# Patient Record
Sex: Male | Born: 1990 | Race: Black or African American | Hispanic: No | Marital: Single | Smoking: Never smoker
Health system: Southern US, Community
[De-identification: ages and names within clinical notes are randomized; demographics above are authoritative.]

## PROBLEM LIST (undated history)

## (undated) HISTORY — PX: OTHER SURGICAL HISTORY: SHX169

---
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
## 2011-03-23 LAB — RAPID STREP SCREEN (MED CTR MEBANE ONLY): Streptococcus, Group A Screen (Direct): POSITIVE — AB

## 2011-09-18 LAB — GC/CHLAMYDIA PROBE AMP, GENITAL
Chlamydia, DNA Probe: NEGATIVE
GC Probe Amp, Genital: NEGATIVE

## 2015-07-18 ENCOUNTER — Emergency Department (HOSPITAL_COMMUNITY)
Admission: EM | Admit: 2015-07-18 | Discharge: 2015-07-18 | Disposition: A | Discharge status: Home / Self Care | Payer: BLUE CROSS/BLUE SHIELD | Attending: Emergency Medicine | Admitting: Emergency Medicine

## 2015-07-18 ENCOUNTER — Emergency Department (HOSPITAL_COMMUNITY): Payer: BLUE CROSS/BLUE SHIELD

## 2015-07-18 ENCOUNTER — Encounter (HOSPITAL_COMMUNITY): Payer: Self-pay | Admitting: *Deleted

## 2015-07-18 DIAGNOSIS — B2799 Infectious mononucleosis, unspecified with other complication: Secondary | ICD-10-CM

## 2015-07-18 DIAGNOSIS — R197 Diarrhea, unspecified: Secondary | ICD-10-CM | POA: Diagnosis not present

## 2015-07-18 DIAGNOSIS — K759 Inflammatory liver disease, unspecified: Secondary | ICD-10-CM | POA: Diagnosis not present

## 2015-07-18 DIAGNOSIS — B2790 Infectious mononucleosis, unspecified without complication: Secondary | ICD-10-CM | POA: Diagnosis not present

## 2015-07-18 DIAGNOSIS — R112 Nausea with vomiting, unspecified: Secondary | ICD-10-CM | POA: Diagnosis present

## 2015-07-18 DIAGNOSIS — B178 Other specified acute viral hepatitis: Secondary | ICD-10-CM

## 2015-07-18 LAB — COMPREHENSIVE METABOLIC PANEL
ALBUMIN: 3.9 g/dL (ref 3.5–5.0)
ALK PHOS: 175 U/L — AB (ref 38–126)
ALT: 660 U/L — ABNORMAL HIGH (ref 17–63)
ANION GAP: 5 (ref 5–15)
AST: 326 U/L — ABNORMAL HIGH (ref 15–41)
BILIRUBIN TOTAL: 0.5 mg/dL (ref 0.3–1.2)
BUN: 12 mg/dL (ref 6–20)
CALCIUM: 8.6 mg/dL — AB (ref 8.9–10.3)
CHLORIDE: 107 mmol/L (ref 101–111)
CO2: 26 mmol/L (ref 22–32)
Creatinine, Ser: 1.2 mg/dL (ref 0.61–1.24)
GFR calc Af Amer: >60 mL/min (ref >60)
GFR calc non Af Amer: >60 mL/min (ref >60)
Glucose, Bld: 96 mg/dL (ref 65–99)
POTASSIUM: 4 mmol/L (ref 3.5–5.1)
SODIUM: 138 mmol/L (ref 135–145)
Total Protein: 7.8 g/dL (ref 6.5–8.1)

## 2015-07-18 LAB — CBC WITH DIFFERENTIAL/PLATELET
BASOS ABS: 0.3 10*3/uL — AB (ref 0.0–0.1)
BASOS PCT: 2 % — AB (ref 0–1)
Eosinophils Absolute: 0.1 10*3/uL (ref 0.0–0.7)
Eosinophils Relative: 0 % (ref 0–5)
HCT: 41.6 % (ref 39.0–52.0)
HEMOGLOBIN: 13.8 g/dL (ref 13.0–17.0)
Lymphocytes Relative: 61 % — ABNORMAL HIGH (ref 12–46)
Lymphs Abs: 10.9 10*3/uL — ABNORMAL HIGH (ref 0.7–4.0)
MCH: 29.2 pg (ref 26.0–34.0)
MCHC: 33.2 g/dL (ref 30.0–36.0)
MCV: 88.1 fL (ref 78.0–100.0)
Monocytes Absolute: 2.1 10*3/uL — ABNORMAL HIGH (ref 0.1–1.0)
Monocytes Relative: 12 % (ref 3–12)
NEUTROS ABS: 4.4 10*3/uL (ref 1.7–7.7)
NEUTROS PCT: 25 % — AB (ref 43–77)
Platelets: 193 10*3/uL (ref 150–400)
RBC: 4.72 MIL/uL (ref 4.22–5.81)
RDW: 13.9 % (ref 11.5–15.5)
WBC: 17.8 10*3/uL — ABNORMAL HIGH (ref 4.0–10.5)

## 2015-07-18 LAB — URINALYSIS, ROUTINE W REFLEX MICROSCOPIC
BILIRUBIN URINE: NEGATIVE
Glucose, UA: NEGATIVE mg/dL
Hgb urine dipstick: NEGATIVE
Ketones, ur: NEGATIVE mg/dL
LEUKOCYTES UA: NEGATIVE
NITRITE: NEGATIVE
Protein, ur: NEGATIVE mg/dL
UROBILINOGEN UA: 0.2 mg/dL (ref 0.0–1.0)
pH: 6 (ref 5.0–8.0)

## 2015-07-18 LAB — LIPASE, BLOOD: Lipase: 18 U/L — ABNORMAL LOW (ref 22–51)

## 2015-07-18 LAB — PROTIME-INR
INR: 0.99 (ref 0.00–1.49)
Prothrombin Time: 13.3 seconds (ref 11.6–15.2)

## 2015-07-18 LAB — ACETAMINOPHEN LEVEL

## 2015-07-18 LAB — MONONUCLEOSIS SCREEN: MONO SCREEN: POSITIVE — AB

## 2015-07-18 LAB — ETHANOL: Alcohol, Ethyl (B): <5 mg/dL (ref <5)

## 2015-07-18 MED ORDER — PROMETHAZINE HCL 25 MG PO TABS: 25.0000 mg | ORAL_TABLET | Freq: Four times a day (QID) | ORAL | Status: DC | PRN

## 2015-07-18 NOTE — ED Notes (Signed)
Pt reports vomiting off and on in the morning x 2 weeks. Denies pain, heartburn, trouble with urination. Reports diarrhea (2 times in the last 24 hours). Diarrhea and vomiting do not seem to correspond together per pt.

## 2015-07-18 NOTE — Discharge Instructions (Signed)
Infectious Mononucleosis Infectious mononucleosis (mono) is a common germ (viral) infection in children, teenagers, and young adults.  CAUSES  Mono is an infection caused by the Malachi Carl virus. The virus is spread by close personal contact with someone who has the infection. It can be passed by contact with your saliva through things such as kissing or sharing drinking glasses. Sometimes, the infection can be spread from someone who does not appear sick but still spreads the virus (asymptomatic carrier state).  SYMPTOMS  The most common symptoms of Mono are:  Sore throat.  Headache.  Fatigue.  Muscle aches.  Swollen glands.  Fever.  Poor appetite.  Enlarged liver or spleen. The less common symptoms can include:  Rash.  Feeling sick to your stomach (nauseous).  Abdominal pain. DIAGNOSIS  Mono is diagnosed by a blood test.  TREATMENT  Treatment of mono is usually at home. There is no medicine that cures this virus. Sometimes hospital treatment is needed in severe cases. Steroid medicine sometimes is needed if the swelling in the throat causes breathing or swallowing problems.  HOME CARE INSTRUCTIONS   Drink enough fluids to keep your urine clear or pale yellow.  Eat soft foods. Cool foods like popsicles or ice cream can soothe a sore throat.  Only take over-the-counter or prescription medicines for pain, discomfort, or fever as directed by your caregiver. Children under 35 years of age should not take aspirin.  Gargle salt water. This may help relieve your sore throat. Put 1 teaspoon (tsp) of salt in 1 cup of warm water. Sucking on hard candy may also help.  Rest as needed.  Start regular activities gradually after the fever is gone. Be sure to rest when tired.  Avoid strenuous exercise or contact sports until your caregiver says it is okay. The liver and spleen could be seriously injured.  Avoid sharing drinking glasses or kissing until your caregiver tells you  that you are no longer contagious. SEEK MEDICAL CARE IF:   Your fever is not gone after 7 days.  Your activity level is not back to normal after 2 weeks.  You have yellow coloring to eyes and skin (jaundice). SEEK IMMEDIATE MEDICAL CARE IF:   You have severe pain in the abdomen or shoulder.  You have trouble swallowing or drooling.  You have trouble breathing.  You develop a stiff neck.  You develop a severe headache.  You cannot stop throwing up (vomiting).  You have convulsions.  You are confused.  You have trouble with balance.  You develop signs of body fluid loss (dehydration):  Weakness.  Sunken eyes.  Pale skin.  Dry mouth.  Rapid breathing or pulse. MAKE SURE YOU:   Understand these instructions.  Will watch your condition.  Will get help right away if you are not doing well or get worse. Document Released: 11/20/2000 Document Revised: 02/15/2012 Document Reviewed: 09/18/2008 [facility] Patient Information 2015 [place], [place]. This information is not intended to replace advice given to you by your health care provider. Make sure you discuss any questions you have with your health care provider.   Call Dr. Benard Rink as discussed for an appointment on Monday or Tuesday for recheck of your symptoms and your lab tests including your liver functions.  It is important that she rest and make sure you drinking plenty of fluids.  You may use Phenergan if needed for nausea or vomiting.  It is important that you avoid any activity such as contact sports on tell your infection  is completely resolved.

## 2015-07-18 NOTE — ED Provider Notes (Signed)
CSN: [id number]     Arrival date & time 07/18/15  4098 History   First MD Initiated Contact with Patient 07/18/15 1001     Chief Complaint  Patient presents with  . Vomiting     (Consider location/radiation/quality/duration/timing/severity/associated sxs/prior Treatment) The history is provided by the patient and a parent.   Gary Humphrey is a 24 y.o. male  Gary Humphrey is a 24 y.o. male presenting with a 2 week history of nausea and emesis occuring in the morning only.  He describes waking feeling nauseated and usually by the time he is brushing his teeth, he is triggered to vomit x 1, then his symptoms are improved.  He denies fevers, chills, abdominal pain, dysuria.  He does endorse having brown watery stools, 2-3 times daily (normal pattern of stooling, but watery stool is new).  He denies bloody stools and has had no recent antibiotic use.  He denies heartburn except when he eats [place] food.  He took pepto bismol for the first time this am which improved his symptoms.  He reports being afraid to eat in case it makes his sx worse but denies eating has actually worsened sx.  He states he woke with a mild sore throat which started today.     History reviewed. No pertinent past medical history. History reviewed. No pertinent past surgical history. No family history on file. Social History  Substance Use Topics  . Smoking status: Never Smoker   . Smokeless tobacco: None  . Alcohol Use: No    Review of Systems  Constitutional: Negative for fever.  HENT: Negative for congestion and sore throat.   Eyes: Negative.   Respiratory: Negative for chest tightness and shortness of breath.   Cardiovascular: Negative for chest pain.  Gastrointestinal: Positive for nausea and vomiting. Negative for abdominal pain and abdominal distention.  Genitourinary: Negative.   Musculoskeletal: Negative for joint swelling, arthralgias and neck pain.  Skin: Negative.  Negative for  rash and wound.  Neurological: Negative for dizziness, weakness, light-headedness, numbness and headaches.  Psychiatric/Behavioral: Negative.       Allergies  Review of patient's allergies indicates no known allergies.  Home Medications   Prior to Admission medications   Medication Sig Start Date End Date Taking? Authorizing Provider  promethazine (PHENERGAN) 25 MG tablet Take 1 tablet (25 mg total) by mouth every 6 (six) hours as needed for nausea or vomiting. 07/18/15   Burgess Amor, PA-C   BP 134/62 mmHg  Pulse 67  Temp(Src) 98 F (36.7 C) (Oral)  Resp 18  Ht 6' (1.829 m)  Wt 214 lb (97.07 kg)  BMI 29.02 kg/m2  SpO2 100% Physical Exam  Constitutional: He appears well-developed and well-nourished.  HENT:  Head: Normocephalic and atraumatic.  Mouth/Throat: Oropharynx is clear and moist. No oropharyngeal exudate.  Eyes: Conjunctivae are normal.  Neck: Normal range of motion.  Cardiovascular: Normal rate, regular rhythm, normal heart sounds and intact distal pulses.   Pulmonary/Chest: Effort normal and breath sounds normal. He has no wheezes.  Abdominal: Soft. Bowel sounds are normal. He exhibits no distension. There is no tenderness. There is no rebound, no guarding, no tenderness at McBurney's point and negative Murphy's sign.  Musculoskeletal: Normal range of motion.  Neurological: He is alert.  Skin: Skin is warm and dry.  Psychiatric: He has a normal mood and affect.  Nursing note and vitals reviewed.   ED Course  Procedures (including critical care time) Labs Review Labs Reviewed  COMPREHENSIVE METABOLIC PANEL -  Abnormal; Notable for the following:    Calcium 8.6 (*)    AST 326 (*)    ALT 660 (*)    Alkaline Phosphatase 175 (*)    All other components within normal limits  LIPASE, BLOOD - Abnormal; Notable for the following:    Lipase 18 (*)    All other components within normal limits  CBC WITH DIFFERENTIAL/PLATELET - Abnormal; Notable for the following:     WBC 17.8 (*)    Neutrophils Relative % 25 (*)    Lymphocytes Relative 61 (*)    Lymphs Abs 10.9 (*)    Monocytes Absolute 2.1 (*)    Basophils Relative 2 (*)    Basophils Absolute 0.3 (*)    All other components within normal limits  URINALYSIS, ROUTINE W REFLEX MICROSCOPIC (NOT AT [facility]) - Abnormal; Notable for the following:    Specific Gravity, Urine >1.030 (*)    All other components within normal limits  ACETAMINOPHEN LEVEL - Abnormal; Notable for the following:    Acetaminophen (Tylenol), Serum <10 (*)    All other components within normal limits  MONONUCLEOSIS SCREEN - Abnormal; Notable for the following:    Mono Screen POSITIVE (*)    All other components within normal limits  ETHANOL  PROTIME-INR  HEPATITIS PANEL, ACUTE    Imaging Review US Abdomen Limited  07/18/2015   CLINICAL DATA:  Nausea, vomiting, and right upper quadrant pain for 2 weeks. Elevated liver function tests.  EXAM: US ABDOMEN LIMITED - RIGHT UPPER QUADRANT  COMPARISON:  None.  FINDINGS: Gallbladder:  No gallstones or wall thickening visualized. No sonographic Murphy sign noted.  Common bile duct:  Diameter: 4 mm  Liver:  No focal lesion identified. Within normal limits in parenchymal echogenicity.  IMPRESSION: Unremarkable right upper quadrant ultrasound.   Electronically Signed   By: Sebastian Ache   On: 07/18/2015 12:57     EKG Interpretation None      MDM   Final diagnoses:  Mononucleosis, infectious, with hepatitis    Patients labs and/or radiological studies were reviewed and considered during the medical decision making and disposition process.   Imaging was reviewed, interpreted and I agree with radiologists reading.  Results were also discussed with patient. Pt's labs discussed with Dr. Jena Gauss, added INR, mono.  Pt to f/u with Dr. Jena Gauss in 5 days for repeat labs, recheck of sx.  He was prescribed phenergan, advised rest, increased fluid intake. Discussed avoiding contact sports, risks for  falls/injury. Pt understands plan.  Mother at bedside agrees and understands plan as well.    Burgess Amor, PA-C 07/18/15 1531  Glynn Octave, MD 07/18/15 [phone]

## 2015-07-19 LAB — HEPATITIS PANEL, ACUTE
HEP B C IGM: NEGATIVE
HEP B S AG: NEGATIVE
Hep A IgM: NEGATIVE

## 2015-07-23 ENCOUNTER — Encounter: Payer: Self-pay | Admitting: Nurse Practitioner

## 2015-07-23 ENCOUNTER — Ambulatory Visit (INDEPENDENT_AMBULATORY_CARE_PROVIDER_SITE_OTHER): Payer: Self-pay | Admitting: Nurse Practitioner

## 2015-07-23 VITALS — BP 139/80 | HR 60 | Temp 97.0°F | Ht 72.0 in | Wt 217.4 lb

## 2015-07-23 DIAGNOSIS — B2790 Infectious mononucleosis, unspecified without complication: Secondary | ICD-10-CM

## 2015-07-23 DIAGNOSIS — R748 Abnormal levels of other serum enzymes: Secondary | ICD-10-CM

## 2015-07-23 LAB — HEPATIC FUNCTION PANEL
ALT: 196 U/L — AB (ref 9–46)
AST: 47 U/L — ABNORMAL HIGH (ref 10–40)
Albumin: 3.8 g/dL (ref 3.6–5.1)
Alkaline Phosphatase: 145 U/L — ABNORMAL HIGH (ref 40–115)
BILIRUBIN DIRECT: 0.1 mg/dL (ref <=0.2)
BILIRUBIN INDIRECT: 0.3 mg/dL (ref 0.2–1.2)
BILIRUBIN TOTAL: 0.4 mg/dL (ref 0.2–1.2)
Total Protein: 7.7 g/dL (ref 6.1–8.1)

## 2015-07-23 LAB — BASIC METABOLIC PANEL
BUN: 10 mg/dL (ref 7–25)
CO2: 25 mmol/L (ref 20–31)
Calcium: 9 mg/dL (ref 8.6–10.3)
Chloride: 104 mmol/L (ref 98–110)
Creat: 1.04 mg/dL (ref 0.60–1.35)
Glucose, Bld: 88 mg/dL (ref 65–99)
Potassium: 4.8 mmol/L (ref 3.5–5.3)
Sodium: 138 mmol/L (ref 135–146)

## 2015-07-23 NOTE — Progress Notes (Signed)
No pcp per patient 

## 2015-07-23 NOTE — Assessment & Plan Note (Signed)
24 year old male diagnosed with infectious mononucleosis and noted significant elevation in liver labs in the ER 5 days ago. Bilirubin was normal. He does not take any routine over-the-counter, prescription, or herbal medications. Does not drink. Does not smoke. He smokes occasional marijuana. Acute hepatitis panel negative. INR normal. Today I will recheck his labs including CBC, basic metabolic profile, hepatic function panel, and we'll add autoimmune labs for completeness. We'll have him return in 4 weeks for follow-up.

## 2015-07-23 NOTE — Progress Notes (Signed)
Primary Care Physician:  No PCP Per Patient Primary Gastroenterologist:  Dr. Jena Gauss  Chief Complaint  Patient presents with  . Abdominal Pain    HPI:   24 year old male presents on referral from emergency department to follow-up on abnormal labs. Patient was in the emergency room 5 days ago complaining of 2 weeks of nausea and vomiting as well as 2-3 loose stools a day, which is new. His bedside exam was essentially normal. His lab results revealed leukocytosis WBC count 17.8, transaminitis with AST/ALT of 326/660, alkaline phosphatase of 175. His mononucleosis screen was positive, INR was normal at 0.99, and acute hepatitis panel including hep C, hep B, and hep a all negative. Ethanol and acetaminophen levels will also normal, lipase normal. Right upper quadrant ultrasound was essentially normal revealing normal common bile duct diameter, no gallstones, liver within normal limits. He was recommended for 5 day follow-up in our office.  Today he states his symptoms have improved. Is keeping food down well, nausea has essentially resolved, and energy level has improved. Denies fever/chills (which has improved), stools are improving and "trying to solidify" although still somewhat loose. Sore throat has improved. He has been getting a lot of rest, fluids, and hot tea. Denies abdominal pain. Denies yellowing of skin eyes, darkened urine, confusion, mental status changes. Denies dyspnea, dizziness, lightheadedness, syncope, near syncope. Denies any other upper or lower GI symptoms.  Note: denies any past medical history to date. Occupation: Copy.   No past medical history on file.  Past Surgical History  Procedure Laterality Date  . None to date      07/23/15    Current Outpatient Prescriptions  Medication Sig Dispense Refill  . promethazine (PHENERGAN) 25 MG tablet Take 1 tablet (25 mg total) by mouth every 6 (six) hours as needed for nausea or vomiting. (Patient  not taking: Reported on 07/23/2015) 30 tablet 0   No current facility-administered medications for this visit.    Allergies as of 07/23/2015  . (No Known Allergies)    Family History  Problem Relation Age of Onset  . Colon cancer Neg Hx   . Breast cancer Maternal Aunt   . Liver disease Neg Hx     Social History   Social History  . Marital Status: Single    Spouse Name: N/A  . Number of Children: N/A  . Years of Education: N/A   Occupational History  . Not on file.   Social History Main Topics  . Smoking status: Never Smoker   . Smokeless tobacco: Not on file     Comment: smokes pot  . Alcohol Use: No  . Drug Use: Yes    Special: Marijuana     Comment: smokes marijuana twice daily   . Sexual Activity: Not on file   Other Topics Concern  . Not on file   Social History Narrative    Review of Systems: General: Negative for anorexia, weight loss, fever, chills. Admits residual fatigue. Eyes: Negative for vision changes.  ENT: Negative for hoarseness, difficulty swallowing. CV: Negative for chest pain, angina, palpitations, dyspnea on exertion, peripheral edema.  Respiratory: Negative for dyspnea at rest, dyspnea on exertion, cough, sputum, wheezing.  GI: See history of present illness. Derm: Negative for rash or itching. Endo: Negative for unusual weight change.  Heme: Negative for bruising or bleeding. Allergy: Negative for rash or hives.    Physical Exam: BP 139/80 mmHg  Pulse 60  Temp(Src) 97 F (36.1 C)  Ht  6' (1.829 m)  Wt 217 lb 6.4 oz (98.612 kg)  BMI 29.48 kg/m2 General:   Alert and oriented. Pleasant and cooperative. Well-nourished and well-developed.  Head:  Normocephalic and atraumatic. Eyes:  Without icterus, sclera clear and conjunctiva pink.  Ears:  Normal auditory acuity. Cardiovascular:  S1, S2 present without murmurs appreciated. Normal pulses noted. Extremities without clubbing or edema. Respiratory:  Clear to auscultation bilaterally.  No wheezes, rales, or rhonchi. No distress.  Gastrointestinal:  +BS, soft, non-tender and non-distended. No hepatomegaly noted. Possible mild/minimal splenomegaly noted just below the left costal margin but without tenderness. No guarding or rebound. No masses appreciated.  Rectal:  Deferred  Skin:  Intact without significant lesions or rashes. Neurologic:  Alert and oriented x4;  grossly normal neurologically. Psych:  Alert and cooperative. Normal mood and affect. Heme/Lymph/Immune: No excessive bruising noted.    07/23/2015 8:38 AM

## 2015-07-23 NOTE — Assessment & Plan Note (Signed)
Patient diagnosed with infectious mononucleosis in the emergency room 5 days ago. Much of his symptoms have begun to resolve including sore throat, fever, chills. Still has some minimal residual fatigue although this is also improving. Still having loose stools but they "seem to be trying to get solid." Had extensive discussion on the importance of avoiding contact sports and acute abdominal trauma due to possible splenomegaly/splenic rupture. Follow-up in 4 weeks.

## 2015-07-23 NOTE — Patient Instructions (Signed)
1. Have your labs drawn when you're able. 2. It is okay to fax your insurance card to our office when you're able to get by work. 3. I will work on filling out her FMLA form we will call you when it is ready. 4. Return for follow-up in 4 weeks.

## 2015-07-24 LAB — CBC WITH DIFFERENTIAL/PLATELET
BASOS ABS: 0.1 10*3/uL (ref 0.0–0.1)
Basophils Relative: 1 % (ref 0–1)
EOS ABS: 0.3 10*3/uL (ref 0.0–0.7)
EOS PCT: 3 % (ref 0–5)
HCT: 40.8 % (ref 39.0–52.0)
Hemoglobin: 13.8 g/dL (ref 13.0–17.0)
Lymphocytes Relative: 64 % — ABNORMAL HIGH (ref 12–46)
Lymphs Abs: 6.5 10*3/uL — ABNORMAL HIGH (ref 0.7–4.0)
MCH: 28.8 pg (ref 26.0–34.0)
MCHC: 33.8 g/dL (ref 30.0–36.0)
MCV: 85 fL (ref 78.0–100.0)
MPV: 11.1 fL (ref 8.6–12.4)
Monocytes Absolute: 0.9 10*3/uL (ref 0.1–1.0)
Monocytes Relative: 9 % (ref 3–12)
Neutro Abs: 2.3 10*3/uL (ref 1.7–7.7)
Neutrophils Relative %: 23 % — ABNORMAL LOW (ref 43–77)
PLATELETS: 275 10*3/uL (ref 150–400)
RBC: 4.8 MIL/uL (ref 4.22–5.81)
RDW: 14.3 % (ref 11.5–15.5)
WBC: 10.1 10*3/uL (ref 4.0–10.5)

## 2015-07-24 LAB — ANA: Anti Nuclear Antibody(ANA): NEGATIVE

## 2015-07-24 LAB — PATHOLOGIST SMEAR REVIEW

## 2015-07-24 LAB — ANTI-SMOOTH MUSCLE ANTIBODY, IGG: SMOOTH MUSCLE AB: 17 U (ref <20)

## 2015-07-25 LAB — ANTI-MICROSOMAL ANTIBODY LIVER / KIDNEY

## 2015-08-22 ENCOUNTER — Encounter: Payer: Self-pay | Admitting: Nurse Practitioner

## 2015-08-22 ENCOUNTER — Ambulatory Visit: Payer: Self-pay | Admitting: Nurse Practitioner

## 2015-08-22 ENCOUNTER — Telehealth: Payer: Self-pay | Admitting: Nurse Practitioner

## 2015-08-22 NOTE — Telephone Encounter (Signed)
Pt was a no show

## 2015-08-22 NOTE — Telephone Encounter (Signed)
Noted  

## 2015-08-22 NOTE — Telephone Encounter (Signed)
Letter mailed

## 2018-09-05 ENCOUNTER — Other Ambulatory Visit: Payer: Self-pay

## 2018-09-05 ENCOUNTER — Emergency Department (HOSPITAL_COMMUNITY): Payer: BLUE CROSS/BLUE SHIELD

## 2018-09-05 ENCOUNTER — Encounter (HOSPITAL_COMMUNITY): Payer: Self-pay | Admitting: Emergency Medicine

## 2018-09-05 ENCOUNTER — Emergency Department (HOSPITAL_COMMUNITY)
Admission: EM | Admit: 2018-09-05 | Discharge: 2018-09-05 | Disposition: A | Discharge status: Home / Self Care | Payer: BLUE CROSS/BLUE SHIELD | Attending: Emergency Medicine | Admitting: Emergency Medicine

## 2018-09-05 DIAGNOSIS — J181 Lobar pneumonia, unspecified organism: Secondary | ICD-10-CM

## 2018-09-05 DIAGNOSIS — J189 Pneumonia, unspecified organism: Secondary | ICD-10-CM | POA: Diagnosis not present

## 2018-09-05 DIAGNOSIS — R079 Chest pain, unspecified: Secondary | ICD-10-CM | POA: Diagnosis present

## 2018-09-05 LAB — BASIC METABOLIC PANEL
ANION GAP: 6 (ref 5–15)
BUN: 9 mg/dL (ref 6–20)
CO2: 27 mmol/L (ref 22–32)
CREATININE: 1.22 mg/dL (ref 0.61–1.24)
Calcium: 9.1 mg/dL (ref 8.9–10.3)
Chloride: 106 mmol/L (ref 98–111)
GFR calc non Af Amer: >60 mL/min (ref >60)
Glucose, Bld: 95 mg/dL (ref 70–99)
Potassium: 4 mmol/L (ref 3.5–5.1)
SODIUM: 139 mmol/L (ref 135–145)

## 2018-09-05 LAB — CBC
HCT: 40.6 % (ref 39.0–52.0)
Hemoglobin: 13.7 g/dL (ref 13.0–17.0)
MCH: 29.7 pg (ref 26.0–34.0)
MCHC: 33.7 g/dL (ref 30.0–36.0)
MCV: 87.9 fL (ref 78.0–100.0)
PLATELETS: 230 10*3/uL (ref 150–400)
RBC: 4.62 MIL/uL (ref 4.22–5.81)
RDW: 13.4 % (ref 11.5–15.5)
WBC: 17.2 10*3/uL — ABNORMAL HIGH (ref 4.0–10.5)

## 2018-09-05 LAB — TROPONIN I
Troponin I: <0.03 ng/mL (ref <0.03)
Troponin I: <0.03 ng/mL (ref <0.03)

## 2018-09-05 MED ORDER — AZITHROMYCIN 250 MG PO TABS: 250.0000 mg | ORAL_TABLET | Freq: Every day | ORAL | 0 refills | Status: AC

## 2018-09-05 MED ORDER — IOHEXOL 300 MG/ML  SOLN
75.0000 mL | Freq: Once | INTRAMUSCULAR | Status: AC | PRN
  Administered 2018-09-05: 75 mL via INTRAVENOUS

## 2018-09-05 MED ORDER — SODIUM CHLORIDE 0.9 % IV SOLN
500.0000 mg | INTRAVENOUS | Status: DC
  Administered 2018-09-05: 500 mg via INTRAVENOUS
  Filled 2018-09-05: qty 500

## 2018-09-05 MED ORDER — SODIUM CHLORIDE 0.9 % IV SOLN
1.0000 g | Freq: Once | INTRAVENOUS | Status: AC
  Administered 2018-09-05: 1 g via INTRAVENOUS
  Filled 2018-09-05: qty 10

## 2018-09-05 NOTE — ED Notes (Signed)
ED Provider at bedside. 

## 2018-09-05 NOTE — ED Provider Notes (Signed)
[facility] EMERGENCY DEPARTMENT Provider Note   CSN: [id number] Arrival date & time: 09/05/18  1509     History   Chief Complaint Chief Complaint  Patient presents with  . Chest Pain    HPI Gary Humphrey is a 27 y.o. male.  Complains of pain in left anterior chest since approximately 1:30 PM today.  No dyspnea, diaphoresis, nausea, ureter, sweats, chills.  Past medical history includes cholesterolemia, but no hypertension or diabetes.  He smokes marijuana but no cigarettes.  He does factory work.  Severity of symptoms is mild to moderate.  Nothing makes symptoms better or worse.     History reviewed. No pertinent past medical history.  Patient Active Problem List   Diagnosis Date Noted  . Infectious mononucleosis 07/23/2015  . Elevated liver enzymes 07/23/2015    Past Surgical History:  Procedure Laterality Date  . None to date     07/23/15        Home Medications    Prior to Admission medications   Medication Sig Start Date End Date Taking? Authorizing Provider  azithromycin (ZITHROMAX) 250 MG tablet Take 1 tablet (250 mg total) by mouth daily. Take first 2 tablets together, then 1 every day until finished. 09/05/18   Donnetta Hutching, MD    Family History Family History  Problem Relation Age of Onset  . Breast cancer Maternal Aunt   . Colon cancer Neg Hx   . Liver disease Neg Hx     Social History Social History   Tobacco Use  . Smoking status: Never Smoker  . Smokeless tobacco: Never Used  . Tobacco comment: smokes pot  Substance Use Topics  . Alcohol use: No    Alcohol/week: 0.0 standard drinks  . Drug use: Yes    Types: Marijuana    Comment: smokes marijuana twice daily      Allergies   Patient has no known allergies.   Review of Systems Review of Systems  All other systems reviewed and are negative.    Physical Exam Updated Vital Signs BP (!) 146/80 (BP Location: Right Arm)   Pulse 66   Temp 99.3 F (37.4 C) (Oral)    Resp 17   Ht 6\' 1"  (1.854 m)   Wt 99.3 kg   SpO2 100%   BMI 28.89 kg/m   Physical Exam  Constitutional: He is oriented to person, place, and time. He appears well-developed and well-nourished.  HENT:  Head: Normocephalic and atraumatic.  Eyes: Conjunctivae are normal.  Neck: Neck supple.  Cardiovascular: Normal rate and regular rhythm.  Pulmonary/Chest: Effort normal and breath sounds normal.  Abdominal: Soft. Bowel sounds are normal.  Musculoskeletal: Normal range of motion.  Neurological: He is alert and oriented to person, place, and time.  Skin: Skin is warm and dry.  Psychiatric: He has a normal mood and affect. His behavior is normal.  Nursing note and vitals reviewed.    ED Treatments / Results  Labs (all labs ordered are listed, but only abnormal results are displayed) Labs Reviewed  CBC - Abnormal; Notable for the following components:      Result Value   WBC 17.2 (*)    All other components within normal limits  BASIC METABOLIC PANEL  TROPONIN I  TROPONIN I    EKG EKG Interpretation  Date/Time:  Monday September 05 2018 15:32:59 EDT Ventricular Rate:  71 PR Interval:  142 QRS Duration: 84 QT Interval:  336 QTC Calculation: 365 R Axis:   85 Text Interpretation:  Normal sinus rhythm T wave abnormality, consider inferior ischemia Abnormal ECG Confirmed by Donnetta Hutching ([id number]) on 09/05/2018 5:16:42 PM   Radiology Dg Chest 2 View  Result Date: 09/05/2018 CLINICAL DATA:  Left-sided chest pain beginning earlier today. EXAM: CHEST - 2 VIEW COMPARISON:  None. FINDINGS: Cardiac silhouette is normal in size and configuration. Normal mediastinal and hilar contours. Small area of increased attenuation in the left upper lobe extension weighted by superimposed ribs. Lungs otherwise clear. No pleural effusion or pneumothorax. Skeletal structures are unremarkable. IMPRESSION: 1. Small area of increased opacity in the left upper lobe. This may be an artifact of superimposed  structures. A small area of infiltrate or atelectasis is possible. No other abnormality. Electronically Signed   By: Amie Portland M.D.   On: 09/05/2018 16:45   Ct Chest W Contrast  Result Date: 09/05/2018 CLINICAL DATA:  Chest pain since 1:30 p.m. today. Small area of increased density in the left upper lobe on chest radiographs earlier today. EXAM: CT CHEST WITH CONTRAST TECHNIQUE: Multidetector CT imaging of the chest was performed during intravenous contrast administration. CONTRAST:  75mL OMNIPAQUE IOHEXOL 300 MG/ML  SOLN COMPARISON:  Chest radiographs earlier today. FINDINGS: Cardiovascular: No significant vascular findings. Normal heart size. No pericardial effusion. Mediastinum/Nodes: No enlarged mediastinal, hilar, or axillary lymph nodes. Thyroid gland, trachea, and esophagus demonstrate no significant findings. Lungs/Pleura: Mass-like consolidation in the lateral aspect of left upper lobe measuring 2.8 x 1.8 cm on image number 53 series 4. There is associated surrounding ground-glass opacity. Together, these span 4.1 x 2.6 cm on that image. In the coronal plane, these extend 2.8 cm in length on image number 50. There is minimal patchy ground-glass opacity throughout the remainder of the lungs. No pleural fluid is seen. Upper Abdomen: The liver is not included in its entirety and may be enlarged. Mild diffuse low density of the liver is also noted. On the upper images through the liver, there is a small amount of free peritoneal fluid adjacent to the liver and a small pericardial effusion. Musculoskeletal: Unremarkable bones. IMPRESSION: 1. Mass-like consolidation in the lateral aspect of the left upper lobe measuring 4.1 x 2.8 x 2.6 cm with surrounding ground-glass opacity. This most likely represents focal, densely consolidated pneumonia with surrounding pneumonitis. A pulmonary infarct is less likely due to the surrounding ground-glass opacity. 2. Minimal patchy ground-glass opacity throughout the  remainder of the lungs. This could represent minimal pulmonary edema or pneumonitis. 3. Hepatic steatosis and possible hepatomegaly. 4. Small pericardial effusion. 5. Small amount of free peritoneal fluid in the upper abdomen. Electronically Signed   By: Beckie Salts M.D.   On: 09/05/2018 18:54    Procedures Procedures (including critical care time)  Medications Ordered in ED Medications  azithromycin (ZITHROMAX) 500 mg in sodium chloride 0.9 % 250 mL IVPB (500 mg Intravenous New Bag/Given 09/05/18 2020)  iohexol (OMNIPAQUE) 300 MG/ML solution 75 mL (75 mLs Intravenous Contrast Given 09/05/18 1808)  cefTRIAXone (ROCEPHIN) 1 g in sodium chloride 0.9 % 100 mL IVPB (0 g Intravenous Stopped 09/05/18 2016)     Initial Impression / Assessment and Plan / ED Course  I have reviewed the triage vital signs and the nursing notes.  Pertinent labs & imaging results that were available during my care of the patient were reviewed by me and considered in my medical decision making (see chart for details).     Presents with left anterior chest pain.  No prodromal illnesses.  CT chest reveals a 4.1  x 2.8 x 2.6 cm mass in the left upper lobe suggestive of a pneumonia.  IV Rocephin, IV Zithromax.  Discharge medications Zithromax.  Follow-up with pulmonologist.  This was discussed with the patient and his significant other in great detail.  Stop smoking.  Final Clinical Impressions(s) / ED Diagnoses   Final diagnoses:  Community acquired pneumonia of left upper lobe of lung [facility])    ED Discharge Orders         Ordered    azithromycin (ZITHROMAX) 250 MG tablet  Daily     09/05/18 2105           Donnetta Hutching, MD 09/05/18 2117

## 2018-09-05 NOTE — ED Triage Notes (Signed)
Pt states chest pain today began at 130pm. Denies other symptoms. Pt states was sitting down for break and pain has been constant. NAD noted in triage.

## 2018-09-05 NOTE — Discharge Instructions (Addendum)
You have a mass in your left lung that appears to be pneumonia.  Start your antibiotic pill tomorrow evening.  You must follow-up with a lung specialist.  Phone number given.  Call for an appointment.  Tylenol for fever or chills.  Increase fluids.  No smoking of any kind

## 2018-09-08 ENCOUNTER — Encounter: Payer: Self-pay | Admitting: Pulmonary Disease

## 2018-09-08 ENCOUNTER — Ambulatory Visit (INDEPENDENT_AMBULATORY_CARE_PROVIDER_SITE_OTHER): Payer: BLUE CROSS/BLUE SHIELD | Admitting: Pulmonary Disease

## 2018-09-08 VITALS — BP 123/74 | HR 69 | Ht 73.0 in | Wt 216.0 lb

## 2018-09-08 DIAGNOSIS — J189 Pneumonia, unspecified organism: Secondary | ICD-10-CM

## 2018-09-08 DIAGNOSIS — F1290 Cannabis use, unspecified, uncomplicated: Secondary | ICD-10-CM

## 2018-09-08 NOTE — Progress Notes (Signed)
Synopsis: Referred in October 2019 for abnormal CT chest > left upper lobe mass treated as pneumonia in the ER on 09/05/2018  Subjective:   PATIENT ID: Gary Humphrey GENDER: male DOB: 1991/03/23, MRN: [id number]   HPI  Chief Complaint  Patient presents with  . Consult    Per patient he had sharp pains on 9/30 and went to ED. Was told that he neither has PNA or a mass in his lungs. Denies any SOB or chest pain since then.     This is a 27 year old male who comes to my clinic today for evaluation of an abnormal CT scan of the chest.  He had a normal childhood though he did have recurrent episodes of bronchitis and asthma as a kid which he says have now resolved.  He participated in sports over the years and he says that he never really had any problems breathing.  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  He smokes marijuana twice a day using tobacco papers to smoke.  He does not use a bong or inhale using marijuana oil through vaping.  He had the sudden onset of chest pain on September 30 in the left side of his chest which progressed.  He was taken to the emergency room where he was found to have a left upper lobe infiltrate and a masslike pattern.  He is here to see me today for follow-up.  He says that he was treated with antibiotics and his symptoms improved.  He has not had recurrence of the symptoms since then.  He has an ongoing cough which is not productive.  He has also had an ongoing problem with lack of appetite and malaise this week.  History reviewed. No pertinent past medical history.   Family History  Problem Relation Age of Onset  . Breast cancer Maternal Aunt   . Colon cancer Neg Hx   . Liver disease Neg Hx      Social History   Socioeconomic History  . Marital status: Single    Spouse name: Not on file  . Number of children: Not on file  . Years of education: Not on file  . Highest education level: Not on file    Occupational History  . Not on file  Social Needs  . Financial resource strain: Not on file  . Food insecurity:    Worry: Not on file    Inability: Not on file  . Transportation needs:    Medical: Not on file    Non-medical: Not on file  Tobacco Use  . Smoking status: Never Smoker  . Smokeless tobacco: Never Used  . Tobacco comment: smokes pot  Substance and Sexual Activity  . Alcohol use: No    Alcohol/week: 0.0 standard drinks  . Drug use: Yes    Types: Marijuana    Comment: smokes marijuana twice daily   . Sexual activity: Yes  Lifestyle  . Physical activity:    Days per week: Not on file    Minutes per session: Not on file  . Stress: Not on file  Relationships  . Social connections:    Talks on phone: Not on file    Gets together: Not on file    Attends religious service: Not on file    Active member of club or organization: Not on file    Attends meetings of clubs or organizations: Not on file    Relationship status: Not on file  .  Intimate partner violence:    Fear of current or ex partner: Not on file    Emotionally abused: Not on file    Physically abused: Not on file    Forced sexual activity: Not on file  Other Topics Concern  . Not on file  Social History Narrative  . Not on file     No Known Allergies   Outpatient Medications Prior to Visit  Medication Sig Dispense Refill  . azithromycin (ZITHROMAX) 250 MG tablet Take 1 tablet (250 mg total) by mouth daily. Take first 2 tablets together, then 1 every day until finished. 6 tablet 0   No facility-administered medications prior to visit.     Review of Systems  Constitutional: Positive for malaise/fatigue. Negative for chills, fever and weight loss.  HENT: Negative for congestion, nosebleeds, sinus pain and sore throat.   Eyes: Negative for photophobia, pain and discharge.  Respiratory: Positive for cough. Negative for hemoptysis, sputum production, shortness of breath and wheezing.    Cardiovascular: Negative for chest pain, palpitations, orthopnea and leg swelling.  Gastrointestinal: Negative for abdominal pain, constipation, diarrhea, nausea and vomiting.  Genitourinary: Negative for dysuria, frequency, hematuria and urgency.  Musculoskeletal: Negative for back pain, joint pain, myalgias and neck pain.  Skin: Negative for itching and rash.  Neurological: Negative for tingling, tremors, sensory change, speech change, focal weakness, seizures, weakness and headaches.  Psychiatric/Behavioral: Negative for memory loss, substance abuse and suicidal ideas. The patient is not nervous/anxious.       Objective:  Physical Exam   Vitals:   09/08/18 1512  BP: 123/74  Pulse: 69  SpO2: 96%  Weight: 216 lb (98 kg)  Height: 6\' 1"  (1.854 m)    Gen: well appearing, no acute distress HENT: NCAT, OP clear, neck supple without masses Eyes: PERRL, EOMi Lymph: no cervical lymphadenopathy PULM: CTA B CV: RRR, no mgr, no JVD GI: BS+, soft, nontender, no hsm Derm: no rash or skin breakdown MSK: normal bulk and tone Neuro: A&Ox4, CN II-XII intact, strength 5/5 in all 4 extremities Psyche: normal mood and affect  CBC    Component Value Date/Time   WBC 17.2 (H) 09/05/2018 1600   RBC 4.62 09/05/2018 1600   HGB 13.7 09/05/2018 1600   HCT 40.6 09/05/2018 1600   PLT 230 09/05/2018 1600   MCV 87.9 09/05/2018 1600   MCH 29.7 09/05/2018 1600   MCHC 33.7 09/05/2018 1600   RDW 13.4 09/05/2018 1600   LYMPHSABS 6.5 (H) 07/23/2015 0921   MONOABS 0.9 07/23/2015 0921   EOSABS 0.3 07/23/2015 0921   BASOSABS 0.1 07/23/2015 0921     Chest imaging: 09/05/2018 CT chest images independently reviewed: air trapping noted bilaterally, left upper lobe mass, pleural based with ground glass surrounding  PFT:  Labs:  Path:  Echo:  Heart Catheterization:  Records from the 09/05/2018 visit with the ER reviewed: He was diagnosed with community-acquired pneumonia and treated with  azithromycin.     Assessment & Plan:   Pneumonia due to infectious organism, unspecified laterality, unspecified part of lung - Plan: CT Chest Wo Contrast  Marijuana use  Discussion: Mr. [name] comes to my clinic today for evaluation of an abnormal CT scan of the chest which showed a masslike consolidation in the left upper lobe in the setting of the abrupt onset of chest pain, malaise, and cough.  This is consistent with a diagnosis of community-acquired pneumonia.  Certainly the diagnosis of community acquired pneumonia has a broad differential diagnosis and it will  be important to make sure that he follows the natural history of that disease.  So today we talked about the fact that he will still have a cough and malaise for the next 2 to 3 weeks and it will take up to 2 to 3 months for the CT scan findings to normalize.  However, if the CT does not clear up he will need to have a biopsy of this abnormal area.  His marijuana smoking likely predisposes him to pneumonia so I counseled him that he should stop.  Plan: Marijuana use: While not necessarily implicated in long-term or chronic lung diseases, your marijuana use likely led to you developing pneumonia For this reason it is a good idea to never smoke anything  Community-acquired pneumonia. You will likely have cough and lack of energy and appetite for several weeks We need to repeat a CT scan of your chest in early December and then see you after that to make sure everything is cleared up If you have worsening cough, new fever, new shortness of breath you need to come back and see [place] right away  We will see you in early December in the [place] office or sooner if needed  25 minutes spent in direct consultation with the patient and his mother, 45 minute visit    Current Outpatient Medications:  .  azithromycin (ZITHROMAX) 250 MG tablet, Take 1 tablet (250 mg total) by mouth daily. Take first 2 tablets together, then 1 every  day until finished., Disp: 6 tablet, Rfl: 0

## 2018-09-08 NOTE — Progress Notes (Deleted)
   Subjective:    Patient ID: Gary Humphrey, male    DOB: 22-Sep-1991, 27 y.o.   MRN: [id number]  HPI    Review of Systems  Constitutional: Positive for appetite change.  Respiratory: Positive for cough.        Objective:   Physical Exam        Assessment & Plan:

## 2018-09-08 NOTE — Patient Instructions (Signed)
Marijuana use: While not necessarily implicated in long-term or chronic lung diseases, your marijuana use likely led to you developing pneumonia For this reason it is a good idea to never smoke anything  Community-acquired pneumonia. You will likely have cough and lack of energy and appetite for several weeks We need to repeat a CT scan of your chest in early December and then see you after that to make sure everything is cleared up If you have worsening cough, new fever, new shortness of breath you need to come back and see [place] right away  We will see you in early December in the [place] office or sooner if needed

## 2018-10-20 ENCOUNTER — Emergency Department (HOSPITAL_COMMUNITY)
Admission: EM | Admit: 2018-10-20 | Discharge: 2018-10-20 | Disposition: A | Discharge status: Home / Self Care | Payer: BLUE CROSS/BLUE SHIELD | Attending: Emergency Medicine | Admitting: Emergency Medicine

## 2018-10-20 ENCOUNTER — Other Ambulatory Visit: Payer: Self-pay

## 2018-10-20 DIAGNOSIS — K529 Noninfective gastroenteritis and colitis, unspecified: Secondary | ICD-10-CM | POA: Diagnosis not present

## 2018-10-20 DIAGNOSIS — R197 Diarrhea, unspecified: Secondary | ICD-10-CM | POA: Diagnosis present

## 2018-10-20 LAB — CBC
HEMATOCRIT: 43 % (ref 39.0–52.0)
HEMOGLOBIN: 13.8 g/dL (ref 13.0–17.0)
MCH: 28.3 pg (ref 26.0–34.0)
MCHC: 32.1 g/dL (ref 30.0–36.0)
MCV: 88.1 fL (ref 80.0–100.0)
NRBC: 0 % (ref 0.0–0.2)
Platelets: 234 10*3/uL (ref 150–400)
RBC: 4.88 MIL/uL (ref 4.22–5.81)
RDW: 13.1 % (ref 11.5–15.5)
WBC: 15.3 10*3/uL — AB (ref 4.0–10.5)

## 2018-10-20 LAB — COMPREHENSIVE METABOLIC PANEL
ALBUMIN: 4.2 g/dL (ref 3.5–5.0)
ALT: 39 U/L (ref 0–44)
ANION GAP: 10 (ref 5–15)
AST: 33 U/L (ref 15–41)
Alkaline Phosphatase: 85 U/L (ref 38–126)
BILIRUBIN TOTAL: 0.9 mg/dL (ref 0.3–1.2)
BUN: 8 mg/dL (ref 6–20)
CHLORIDE: 101 mmol/L (ref 98–111)
CO2: 26 mmol/L (ref 22–32)
Calcium: 8.9 mg/dL (ref 8.9–10.3)
Creatinine, Ser: 1.1 mg/dL (ref 0.61–1.24)
GFR calc Af Amer: >60 mL/min (ref >60)
Glucose, Bld: 90 mg/dL (ref 70–99)
Potassium: 3.6 mmol/L (ref 3.5–5.1)
Sodium: 137 mmol/L (ref 135–145)
TOTAL PROTEIN: 8.6 g/dL — AB (ref 6.5–8.1)

## 2018-10-20 LAB — URINALYSIS, ROUTINE W REFLEX MICROSCOPIC
BILIRUBIN URINE: NEGATIVE
GLUCOSE, UA: NEGATIVE mg/dL
HGB URINE DIPSTICK: NEGATIVE
KETONES UR: 5 mg/dL — AB
NITRITE: NEGATIVE
PROTEIN: 100 mg/dL — AB
Specific Gravity, Urine: 1.036 — ABNORMAL HIGH (ref 1.005–1.030)
pH: 5 (ref 5.0–8.0)

## 2018-10-20 LAB — LIPASE, BLOOD: LIPASE: 25 U/L (ref 11–51)

## 2018-10-20 LAB — GROUP A STREP BY PCR: Group A Strep by PCR: NOT DETECTED

## 2018-10-20 MED ORDER — ONDANSETRON 4 MG PO TBDP
4.0000 mg | ORAL_TABLET | Freq: Once | ORAL | Status: AC | PRN
  Administered 2018-10-20: 4 mg via ORAL

## 2018-10-20 MED ORDER — ACETAMINOPHEN 325 MG PO TABS
ORAL_TABLET | ORAL | Status: AC
  Filled 2018-10-20: qty 2

## 2018-10-20 MED ORDER — ONDANSETRON 4 MG PO TBDP
ORAL_TABLET | ORAL | Status: AC
  Filled 2018-10-20: qty 1

## 2018-10-20 MED ORDER — ONDANSETRON HCL 4 MG PO TABS: 4.0000 mg | ORAL_TABLET | Freq: Four times a day (QID) | ORAL | 0 refills | Status: AC

## 2018-10-20 MED ORDER — SODIUM CHLORIDE 0.9% FLUSH: 1000.0000 mL | Freq: Once | INTRAVENOUS | Status: DC

## 2018-10-20 MED ORDER — ACETAMINOPHEN 500 MG PO TABS
1000.0000 mg | ORAL_TABLET | Freq: Once | ORAL | Status: AC
  Administered 2018-10-20: 1000 mg via ORAL
  Filled 2018-10-20: qty 2

## 2018-10-20 MED ORDER — ACETAMINOPHEN 325 MG PO TABS
650.0000 mg | ORAL_TABLET | Freq: Once | ORAL | Status: AC | PRN
  Administered 2018-10-20: 650 mg via ORAL

## 2018-10-20 MED ORDER — SODIUM CHLORIDE 0.9 % IV BOLUS
1000.0000 mL | Freq: Once | INTRAVENOUS | Status: AC
  Administered 2018-10-20: 1000 mL via INTRAVENOUS

## 2018-10-20 NOTE — Discharge Instructions (Addendum)
Your lab work suggest a gastroenteritis.  Please use 1000 mg of Tylenol every 4 hours for temperature elevation.  Please increase clear liquids.  Wash hands frequently.  Please do not allow anyone to share your eating utensils.  Use Zofran every 6 hours if needed for nausea/vomiting.  See your primary physician or return to the emergency department if this condition is not improving.

## 2018-10-20 NOTE — ED Provider Notes (Signed)
[facility] EMERGENCY DEPARTMENT Provider Note   CSN: [id number] Arrival date & time: 10/20/18  1457     History   Chief Complaint Chief Complaint  Patient presents with  . Emesis  . Diarrhea    HPI Gary Humphrey is a 27 y.o. male.  Patient is a 27 year old male who presents to the emergency department with a complaint of nausea and vomiting and generally not feeling well.  The patient states this problem started on yesterday November 13.  He says he was not feeling well, but went into work.  He was only able to stay about 4 hours before he was having vomiting and diarrhea and body aches and chills.  The patient states that he had probably 4 or 5 episodes of diarrhea on yesterday.  He had vomiting on yesterday.  He also reports some problems with sore throat on yesterday.  He was able to get liquids down, but foods were uncomfortable.  Today he has had 4 more episodes of diarrhea, chills, body aches.  The sore throat is still present, but not as intense as on yesterday.  The patient denies being out of the country recently.  He denies any known spoiled or bad foods.  He has not had any recent changes in medications.  He states that there are several folks at his workplace that have been sick recently.  He presents now for assistance with this issue.  The history is provided by the patient.  Emesis   Associated symptoms include diarrhea, a fever and myalgias. Pertinent negatives include no abdominal pain, no arthralgias and no cough.  Diarrhea   Associated symptoms include vomiting and myalgias. Pertinent negatives include no abdominal pain, no arthralgias and no cough.    No past medical history on file.  Patient Active Problem List   Diagnosis Date Noted  . Infectious mononucleosis 07/23/2015  . Elevated liver enzymes 07/23/2015    Past Surgical History:  Procedure Laterality Date  . None to date     07/23/15        Home Medications    Prior to Admission  medications   Medication Sig Start Date End Date Taking? Authorizing Provider  azithromycin (ZITHROMAX) 250 MG tablet Take 1 tablet (250 mg total) by mouth daily. Take first 2 tablets together, then 1 every day until finished. 09/05/18   Donnetta Hutching, MD    Family History Family History  Problem Relation Age of Onset  . Breast cancer Maternal Aunt   . Colon cancer Neg Hx   . Liver disease Neg Hx     Social History Social History   Tobacco Use  . Smoking status: Never Smoker  . Smokeless tobacco: Never Used  . Tobacco comment: smokes pot  Substance Use Topics  . Alcohol use: No    Alcohol/week: 0.0 standard drinks  . Drug use: Yes    Types: Marijuana    Comment: smokes marijuana twice daily      Allergies   Patient has no known allergies.   Review of Systems Review of Systems  Constitutional: Positive for activity change, appetite change, fatigue and fever.       All ROS Neg except as noted in HPI  HENT: Positive for congestion and postnasal drip. Negative for nosebleeds.   Eyes: Negative for photophobia and discharge.  Respiratory: Negative for cough, shortness of breath and wheezing.   Cardiovascular: Negative for chest pain and palpitations.  Gastrointestinal: Positive for diarrhea, nausea and vomiting. Negative for abdominal pain  and blood in stool.  Genitourinary: Negative for dysuria, frequency and hematuria.  Musculoskeletal: Positive for myalgias. Negative for arthralgias, back pain and neck pain.  Skin: Negative.   Neurological: Negative for dizziness, seizures and speech difficulty.  Psychiatric/Behavioral: Negative for confusion and hallucinations.     Physical Exam Updated Vital Signs BP 137/75 (BP Location: Right Arm)   Pulse 85   Temp (!) 103 F (39.4 C) (Oral)   Resp 20   SpO2 100%   Physical Exam  Constitutional: He is oriented to person, place, and time. He appears well-developed and well-nourished.  Non-toxic appearance. He appears ill.    HENT:  Head: Normocephalic.  Right Ear: Tympanic membrane and external ear normal.  Left Ear: Tympanic membrane and external ear normal.  Nasal congestion present.  There is increased redness of the posterior pharynx.  Uvula is in the midline.  Eyes: Pupils are equal, round, and reactive to light. EOM and lids are normal.  Neck: Normal range of motion. Neck supple. Carotid bruit is not present.  Cardiovascular: Normal rate, regular rhythm, normal heart sounds, intact distal pulses and normal pulses.  Pulmonary/Chest: Breath sounds normal. No respiratory distress.  Abdominal: Soft. Bowel sounds are normal. There is no tenderness. There is no guarding.  Musculoskeletal: Normal range of motion.  Lymphadenopathy:       Head (right side): No submandibular adenopathy present.       Head (left side): No submandibular adenopathy present.    He has no cervical adenopathy.  Neurological: He is alert and oriented to person, place, and time. He has normal strength. No cranial nerve deficit or sensory deficit.  Skin: Skin is warm and dry.  Psychiatric: He has a normal mood and affect. His speech is normal.  Nursing note and vitals reviewed.    ED Treatments / Results  Labs (all labs ordered are listed, but only abnormal results are displayed) Labs Reviewed  COMPREHENSIVE METABOLIC PANEL - Abnormal; Notable for the following components:      Result Value   Total Protein 8.6 (*)    All other components within normal limits  CBC - Abnormal; Notable for the following components:   WBC 15.3 (*)    All other components within normal limits  URINALYSIS, ROUTINE W REFLEX MICROSCOPIC - Abnormal; Notable for the following components:   Color, Urine AMBER (*)    APPearance HAZY (*)    Specific Gravity, Urine 1.036 (*)    Ketones, ur 5 (*)    Protein, ur 100 (*)    Leukocytes, UA TRACE (*)    Bacteria, UA FEW (*)    All other components within normal limits  LIPASE, BLOOD     EKG None  Radiology No results found.  Procedures Procedures (including critical care time)  Medications Ordered in ED Medications  sodium chloride 0.9 % bolus 1,000 mL (1,000 mLs Intravenous New Bag/Given 10/20/18 1940)  ondansetron (ZOFRAN-ODT) disintegrating tablet 4 mg (4 mg Oral Given 10/20/18 1513)  acetaminophen (TYLENOL) tablet 650 mg (650 mg Oral Given 10/20/18 1513)  acetaminophen (TYLENOL) tablet 1,000 mg (1,000 mg Oral Given 10/20/18 1926)     Initial Impression / Assessment and Plan / ED Course  I have reviewed the triage vital signs and the nursing notes.  Pertinent labs & imaging results that were available during my care of the patient were reviewed by me and considered in my medical decision making (see chart for details).       Final Clinical Impressions(s) / ED  Diagnoses MDM  Temperature elevated upon admission to the emergency department.  Pulse oximetry was on 99 to 100% on room air.  Patient was treated initially with Tylenol.  Labs were obtained in triage.  Lipase was normal at 25.  The comprehensive metabolic panel was nonacute.  The electrolytes were within normal limits. Complete blood count shows an elevation of the white blood cells of 15,300.  There is no shift to the left.  Urine analysis shows a specific gravity to be elevated at 1.036.  There is 100 units of protein present.  Trace leukocyte esterase noted and few bacteria.  Recheck.  Temperature is up to 103.  IV bolus of fluids given to the patient.  Tylenol 1000 mg also given to the patient.  There is symmetrical rise and fall of the chest.  Patient speaks in complete sentences.  No recent vomiting since being in the emergency department.  No diarrhea since being in the emergency department.  Patient feels better after IV fluids and Zofran.  Patient ambulatory in the room as well as in the hall without problem.  Prescription for Zofran given to the patient.  Have asked the patient to  slowly incorporate liquids and gradually increase to solid foods.  Patient is in agreement with this plan.   Final diagnoses:  Gastroenteritis    ED Discharge Orders         Ordered    ondansetron (ZOFRAN) 4 MG tablet  Every 6 hours     10/20/18 2140           Ivery QualeBryant, Alec Jaros, PA-C 10/21/18 0059    Sabas SousBero, Michael M, MD 10/21/18 [phone]

## 2018-10-20 NOTE — ED Triage Notes (Addendum)
Pt c/o n/v/d without abdominal pain since yesterday. Reports chills. Pt took Tylenol at 0930 this morning.

## 2018-10-20 NOTE — ED Notes (Signed)
Patient given discharge instruction, verbalized understand. IV removed, band aid applied. Patient ambulatory out of the department.  

## 2018-11-23 ENCOUNTER — Ambulatory Visit (HOSPITAL_COMMUNITY): Payer: BLUE CROSS/BLUE SHIELD

## 2019-06-08 IMAGING — CT CT CHEST W/ CM
2 of 3 series · 15 of 36 positions shown, 18 images · IV contrast (omnipaque)
Comparison: Chest radiographs earlier today.

CLINICAL DATA: Chest pain since [DATE] p.m. today. Small area of
increased density in the left upper lobe on chest radiographs
earlier today.

EXAM:
CT CHEST WITH CONTRAST
TECHNIQUE: Multidetector CT imaging of the chest was performed during
intravenous contrast administration.
CONTRAST:  75mL OMNIPAQUE IOHEXOL 300 MG/ML  SOLN

[Series 2: axial st · axial · 0.73mm/px · z∈[+1297,+1571]mm · 12 of 161 slices shown, 15 images]
[im 12/161  mediastinal]
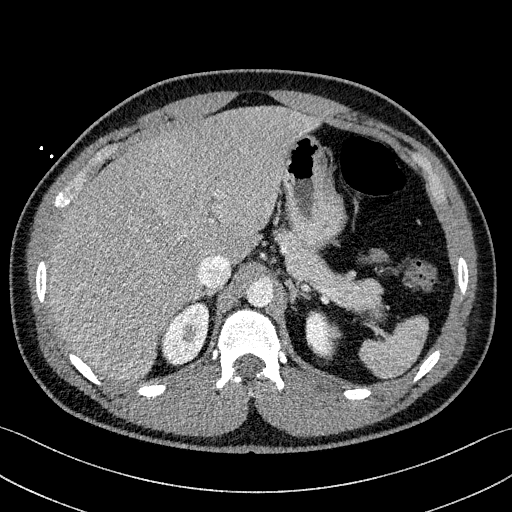
[im 12/161  lung]
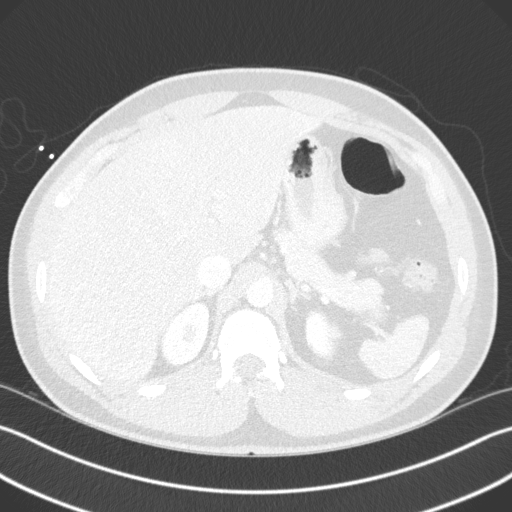
[im 24/161  lung]
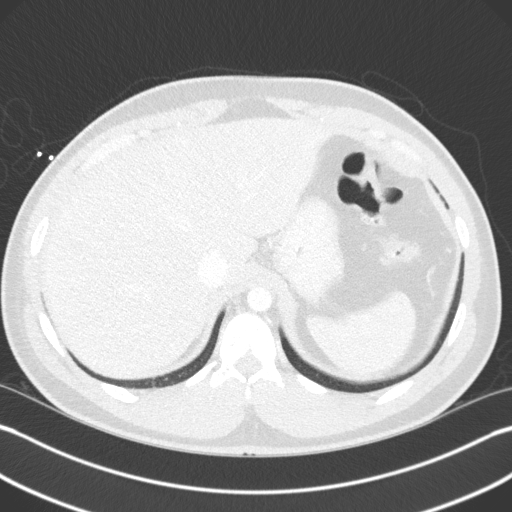
[im 36/161  lung]
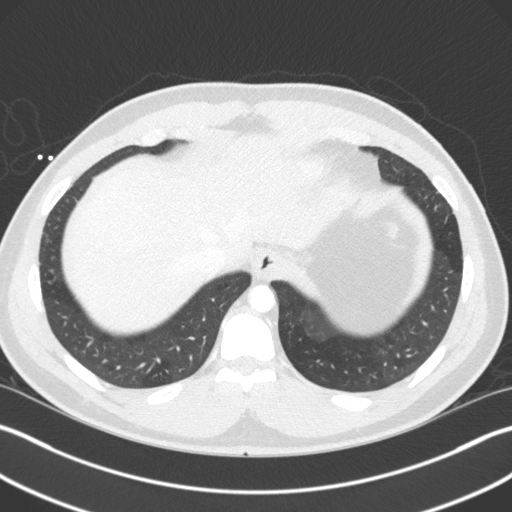
[im 48/161  lung]
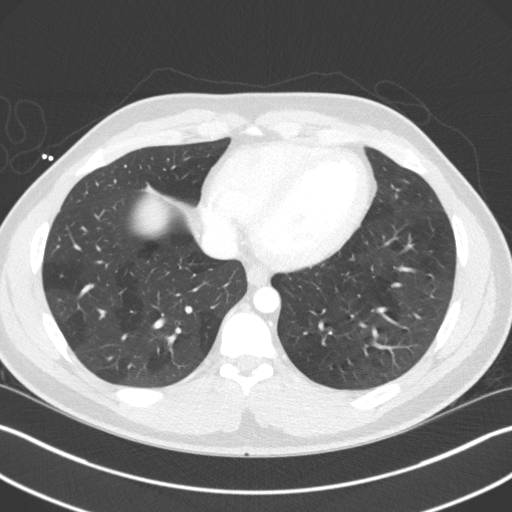
[im 60/161  mediastinal]
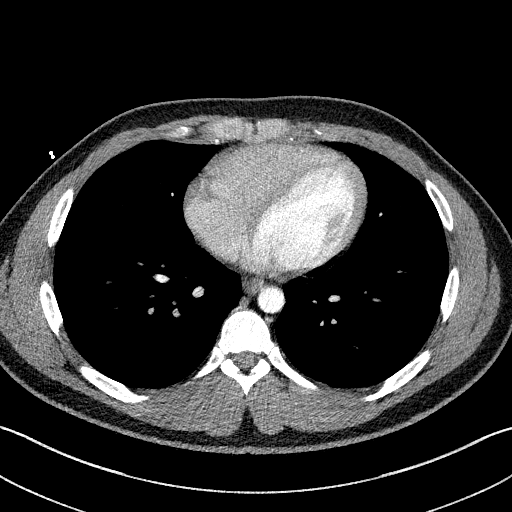
[im 60/161  lung]
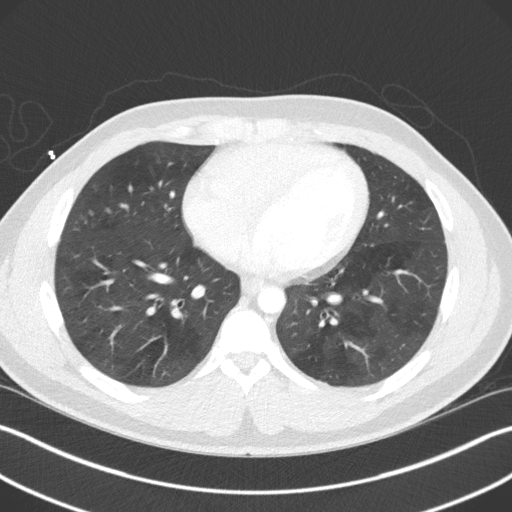
[im 72/161  lung]
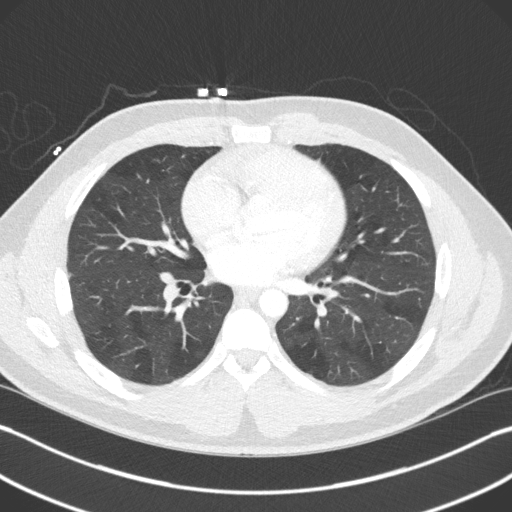
[im 89/161  lung]
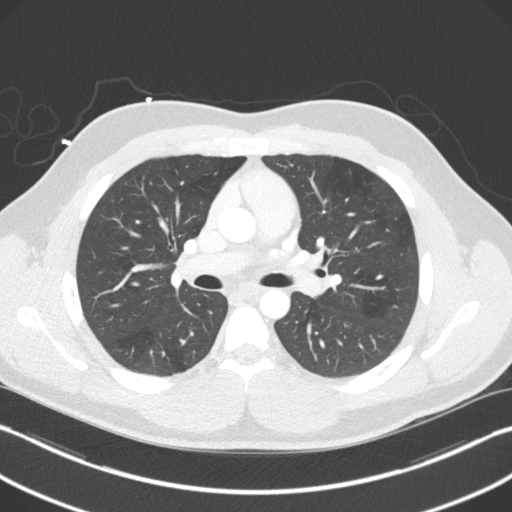
[im 101/161  lung]
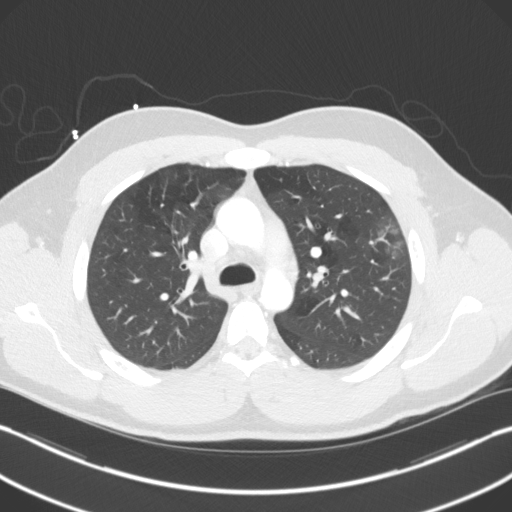
[im 113/161  mediastinal]
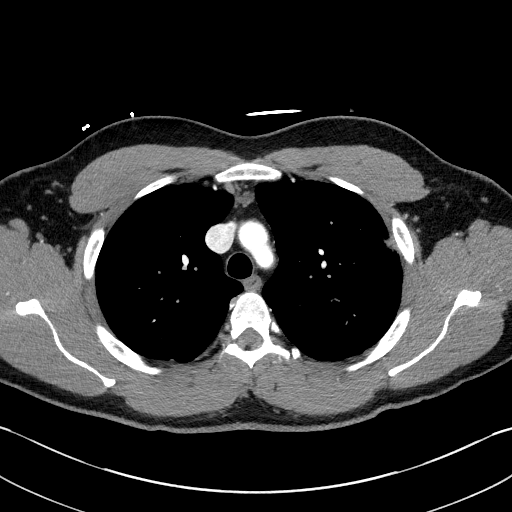
[im 113/161  lung]
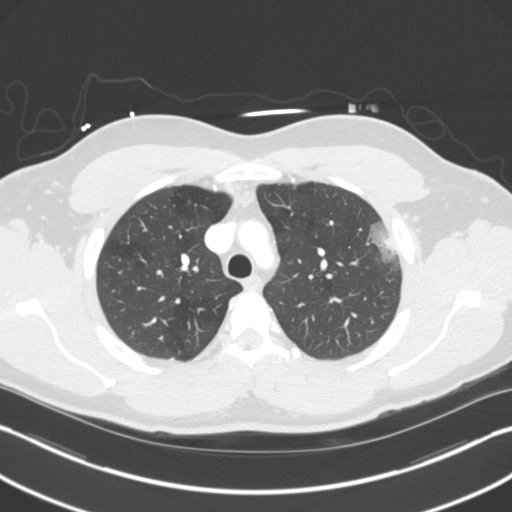
[im 125/161  lung]
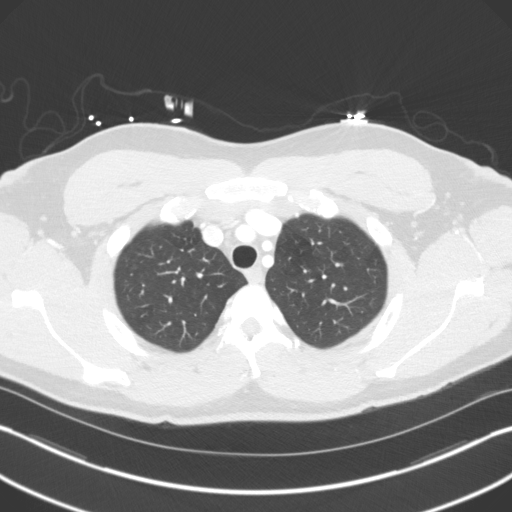
[im 137/161  lung]
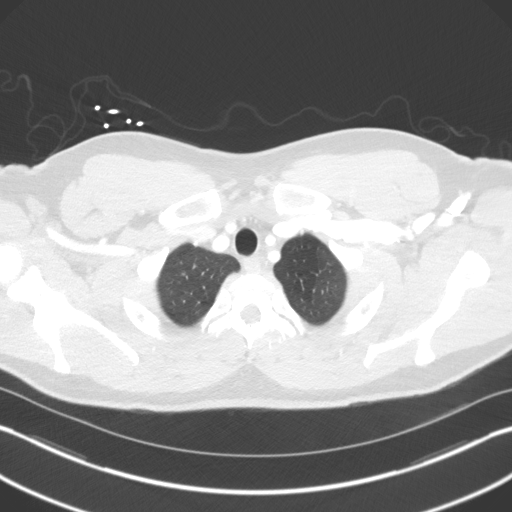
[im 149/161  lung]
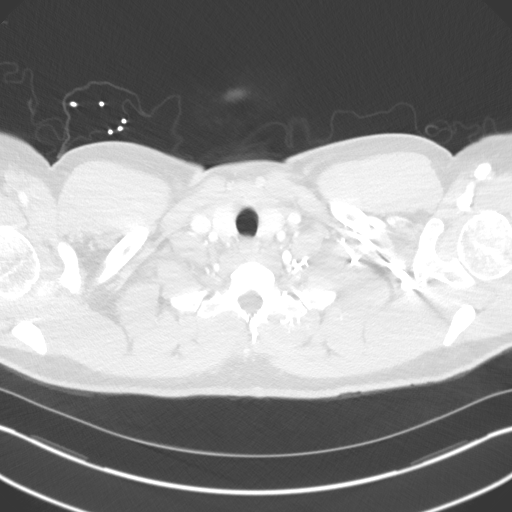

[Series 5: coronal · coronal · 0.70mm/px · 3 of 129 slices shown]
[im 26/129  lung]
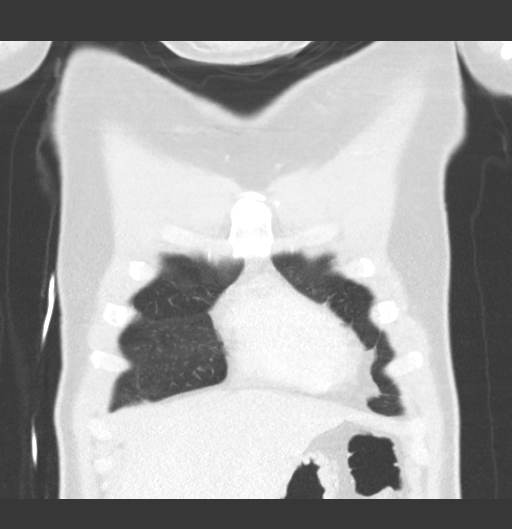
[im 52/129  lung]
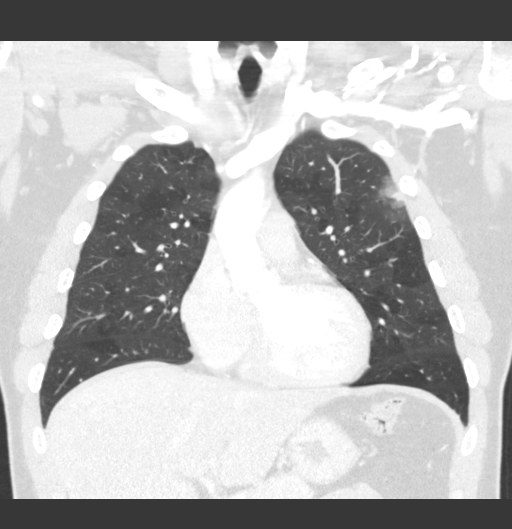
[im 77/129  lung]
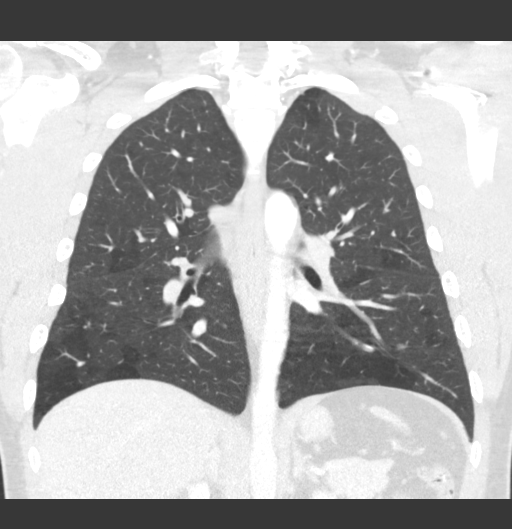

[15 of 36 positions shown; findings below may reference images not displayed]

FINDINGS: Cardiovascular: No significant vascular findings. Normal heart size.
No pericardial effusion.

Mediastinum/Nodes: No enlarged mediastinal, hilar, or axillary lymph
nodes. Thyroid gland, trachea, and esophagus demonstrate no
significant findings.

Lungs/Pleura: Mass-like consolidation in the lateral aspect of left
upper lobe measuring 2.8 x 1.8 cm on image number 53 series 4. There
is associated surrounding ground-glass opacity. Together, these span
4.1 x 2.6 cm on that image. In the coronal plane, these extend
cm in length on image number 50.

There is minimal patchy ground-glass opacity throughout the
remainder of the lungs. No pleural fluid is seen.

Upper Abdomen: The liver is not included in its entirety and may be
enlarged. Mild diffuse low density of the liver is also noted. On
the upper images through the liver, there is a small amount of free
peritoneal fluid adjacent to the liver and a small pericardial
effusion.

Musculoskeletal: Unremarkable bones.
IMPRESSION: 1. Mass-like consolidation in the lateral aspect of the left upper
lobe measuring 4.1 x 2.8 x 2.6 cm with surrounding ground-glass
opacity. This most likely represents focal, densely consolidated
pneumonia with surrounding pneumonitis. A pulmonary infarct is less
likely due to the surrounding ground-glass opacity.
2. Minimal patchy ground-glass opacity throughout the remainder of
the lungs. This could represent minimal pulmonary edema or
pneumonitis.
3. Hepatic steatosis and possible hepatomegaly.
4. Small pericardial effusion.
5. Small amount of free peritoneal fluid in the upper abdomen.

## 2020-02-01 ENCOUNTER — Other Ambulatory Visit: Payer: Self-pay | Admitting: *Deleted
